# Patient Record
Sex: Female | Born: 1985 | Race: White | Hispanic: No | Marital: Single | Smoking: Never smoker
Health system: Southern US, Community
[De-identification: ages and names within clinical notes are randomized; demographics above are authoritative.]

## PROBLEM LIST (undated history)

## (undated) DIAGNOSIS — G40409 Other generalized epilepsy and epileptic syndromes, not intractable, without status epilepticus: Secondary | ICD-10-CM

## (undated) DIAGNOSIS — G40209 Localization-related (focal) (partial) symptomatic epilepsy and epileptic syndromes with complex partial seizures, not intractable, without status epilepticus: Secondary | ICD-10-CM

## (undated) HISTORY — DX: Localization-related (focal) (partial) symptomatic epilepsy and epileptic syndromes with complex partial seizures, not intractable, without status epilepticus: G40.209

## (undated) HISTORY — PX: OTHER SURGICAL HISTORY: SHX169

## (undated) HISTORY — DX: Other generalized epilepsy and epileptic syndromes, not intractable, without status epilepticus: G40.409

---

## 2008-12-05 ENCOUNTER — Emergency Department (HOSPITAL_COMMUNITY): Admission: EM | Admit: 2008-12-05 | Discharge: 2008-12-05 | Payer: Self-pay | Admitting: Emergency Medicine

## 2009-07-25 ENCOUNTER — Emergency Department (HOSPITAL_COMMUNITY): Admission: EM | Admit: 2009-07-25 | Discharge: 2009-07-25 | Payer: Self-pay | Admitting: Family Medicine

## 2010-04-24 IMAGING — CR DG FOREARM 2V*L*
2 series · 2 of 2 positions shown · non-contrast
Comparison: None

CLINICAL DATA: History given of injury and pain.  History of soft
tissue swelling.

LEFT FOREARM - 2 VIEW

[view not recorded (1 of 2)]
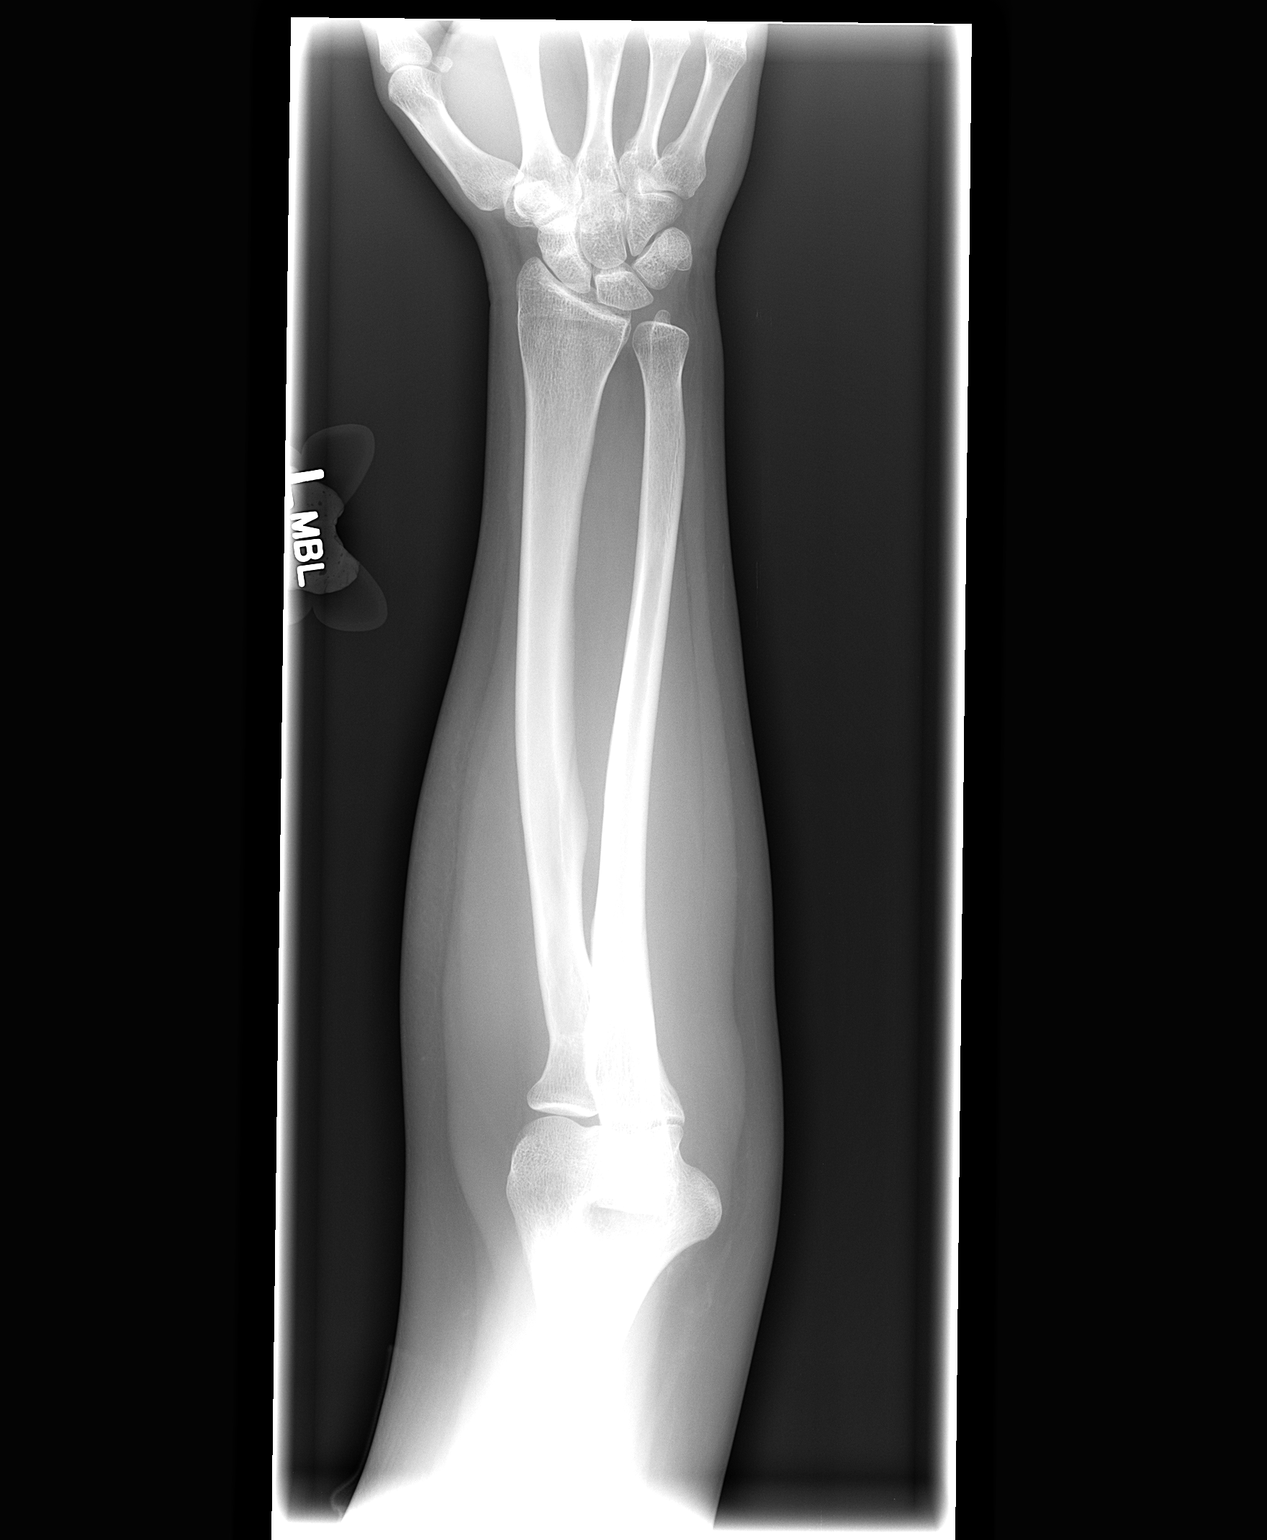

[view not recorded (2 of 2)]
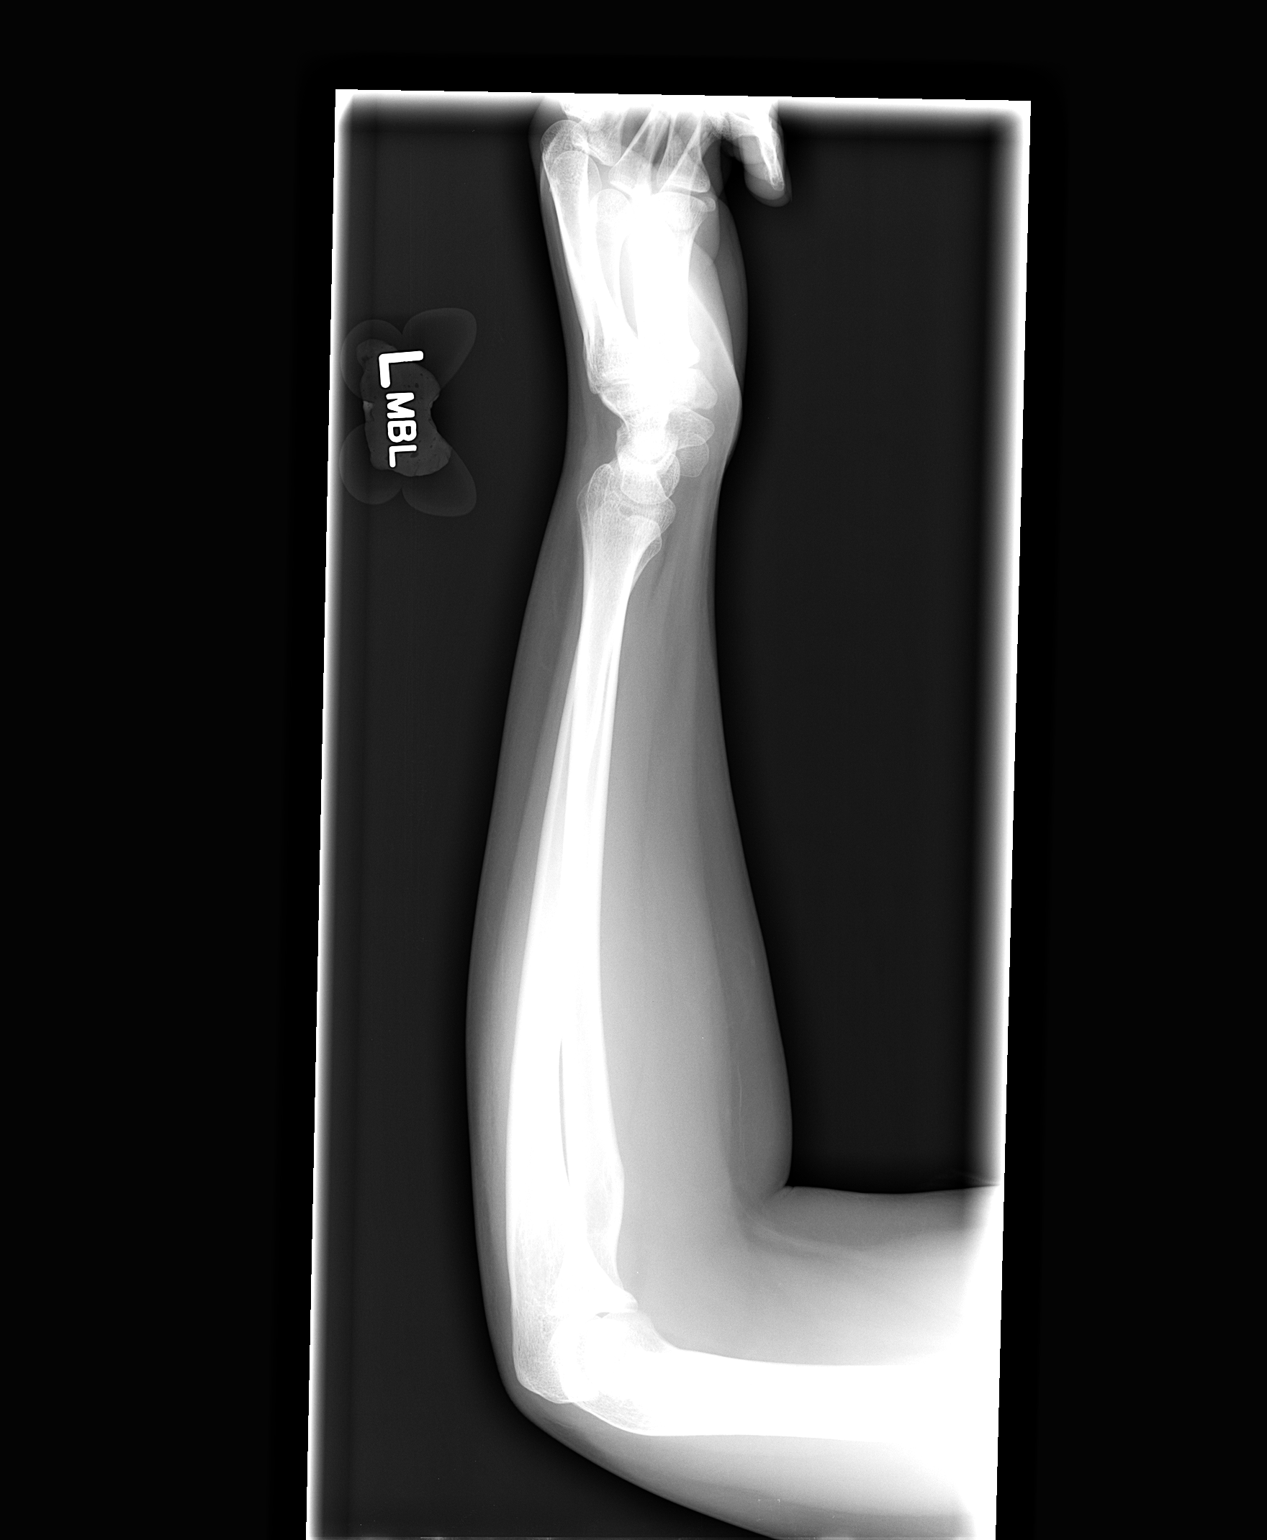

[2 of 2 positions shown; findings below may reference images not displayed]

FINDINGS: Alignment is normal.  Joint spaces are preserved.  No
fracture or dislocation is evident.  No soft tissue lesions are
seen.
IMPRESSION: No fracture is evident.

## 2012-07-04 ENCOUNTER — Telehealth: Payer: Self-pay | Admitting: *Deleted

## 2012-07-04 NOTE — Telephone Encounter (Signed)
patient requesting lamotrigne -100mg  refill, need medicine by thurday, and can't go without it.

## 2012-07-05 MED ORDER — LAMOTRIGINE 100 MG PO TABS: 100.0000 mg | ORAL_TABLET | Freq: Two times a day (BID) | ORAL | Status: DC

## 2012-07-05 NOTE — Telephone Encounter (Signed)
I was out of the office yesterday.  [facility] Patient.  [facility]

## 2012-07-10 ENCOUNTER — Encounter: Payer: Self-pay | Admitting: Neurology

## 2012-07-10 ENCOUNTER — Ambulatory Visit (INDEPENDENT_AMBULATORY_CARE_PROVIDER_SITE_OTHER): Payer: PRIVATE HEALTH INSURANCE | Admitting: Neurology

## 2012-07-10 VITALS — BP 110/69 | HR 85 | Ht 64.0 in | Wt 192.0 lb

## 2012-07-10 DIAGNOSIS — G40409 Other generalized epilepsy and epileptic syndromes, not intractable, without status epilepticus: Secondary | ICD-10-CM

## 2012-07-10 DIAGNOSIS — R569 Unspecified convulsions: Secondary | ICD-10-CM

## 2012-07-10 DIAGNOSIS — G40209 Localization-related (focal) (partial) symptomatic epilepsy and epileptic syndromes with complex partial seizures, not intractable, without status epilepticus: Secondary | ICD-10-CM

## 2012-07-10 NOTE — Progress Notes (Signed)
HPI:  27 year old right-handed white single female, previous Dr. Imagene Gurney patient, follow up for seizure.  She was the product of a  [redacted] week gestation and  presented as breech presentation. She had hyperbilirubinemia at birth and states that she was on a  respirator and had chest tubes for pneumothoraces. Her development is unknown but she began having seizures at 54 months of age with fever.  At that time EEG was normal and she was treated with phenobarbital for 16 months. At age 21-1/1 years while  off of phenobarbital she had her first nonfebrile seizure. She was started on phenobarbital at age 106 months which was discontinued after one year. At age 44 years she had adversive seizures characterized with  her head turning to the right, her eyes elevating and right arm movement. She was confused and aphasic after the seizure. At age 89 an EEG showed 2-3Hz  spike and wave activity more on the left than the right. MRI study of the brain in 1994 was normal. She was started on Tegretol.  At age 89 an EEG was abnormal showing right occipital paroxysmal polymorphic spike and slow wave discharges. In 1995 she had a seizure with fever and another in November 1995. Repeat EEG in February 1997 at age 73 was abnormal showing diffuse polyspike and wave activity. Tegretol was discontinued in 1999. An EEG July 2002 was normal.  From ages 80 to 89 she was on no medications and had no seizures. At age 25 she had a seizure and  was placed on Depakote. She never had seizures on Depakote. At her request she discontinued depakote at age 46 to change to lamotrigine. She has not had any seizures on lamotrigine.   She denies macropsia, micropsia, dj vu, strange odors or tastes. At times she had a premonition that something was going to happen before her seizure. She denies any side effects such as ataxia or double vision.   She is currently working as an Programmer, multimedia, Clinical research associate for [employer], driving, living alone.  She is taking  Lamotrigen 100mg  bid, no side effect.  Last seizure was when at age 3 years, no prodrome. Last lamotrigine level was 2.  Review of Systems  Out of a complete 14 system review, the patient complains of only the following symptoms, and all other reviewed systems are negative.   Constitutional:   N/A Cardiovascular:  N/A Ear/Nose/Throat:  N/A Skin: N/A Eyes: N/A Respiratory: N/A Gastroitestinal: N/A    Hematology/Lymphatic:  N/A Endocrine:  N/A Musculoskeletal:N/A Allergy/Immunology: N/A Neurological: N/A Psychiatric:    N/A  PHYSICAL EXAMINATOINS:  Generalized: In no acute distress  Neck: Supple, no carotid bruits   Cardiac: Regular rate rhythm  Pulmonary: Clear to auscultation bilaterally  Musculoskeletal: No deformity  Neurological examination  Mentation: Alert oriented to time, place, history taking, and causual conversation  Cranial nerve II-XII: Pupils were equal round reactive to light extraocular movements were full, visual field were full on confrontational test. facial sensation and strength were normal. hearing was intact to finger rubbing bilaterally. Uvula tongue midline.  head turning and shoulder shrug and were normal and symmetric.Tongue protrusion into cheek strength was normal.  Motor: normal tone, bulk and strength.  Sensory: Intact to fine touch, pinprick, preserved vibratory sensation, and proprioception at toes.  Coordination: Normal finger to nose, heel-to-shin bilaterally there was no truncal ataxia  Gait: Rising up from seated position without assistance, normal stance, without trunk ataxia, moderate stride, good arm swing, smooth turning, able to perform tiptoe, and  heel walking without difficulty.   Romberg signs: Negative  Deep tendon reflexes: Brachioradialis 2/2, biceps 2/2, triceps 2/2, patellar 2/2, Achilles 2/2, plantar responses were flexor bilaterally.  Assessement  And Plan:  27 yo White Female with long standing history of  generalized epilepsy.  1. Will continue current lamotrigine. 2. She will contact our office when she is ready to taper off AEDs. 3. It was discussed by Dr. Sandria Manly in the past for her coming off medication and not driving a car for 6 months. 4. RTC with Eber Jones in one year

## 2012-11-24 ENCOUNTER — Other Ambulatory Visit: Payer: Self-pay | Admitting: Neurology

## 2013-03-05 ENCOUNTER — Telehealth: Payer: Self-pay | Admitting: Neurology

## 2013-03-05 MED ORDER — LAMOTRIGINE 100 MG PO TABS: 100.0000 mg | ORAL_TABLET | Freq: Two times a day (BID) | ORAL | Status: DC

## 2013-03-05 NOTE — Telephone Encounter (Signed)
Rx has been sent to [facility] 

## 2013-06-27 ENCOUNTER — Other Ambulatory Visit: Payer: Self-pay | Admitting: Neurology

## 2013-07-02 ENCOUNTER — Ambulatory Visit (INDEPENDENT_AMBULATORY_CARE_PROVIDER_SITE_OTHER): Payer: PRIVATE HEALTH INSURANCE | Admitting: Neurology

## 2013-07-02 ENCOUNTER — Encounter (INDEPENDENT_AMBULATORY_CARE_PROVIDER_SITE_OTHER): Payer: Self-pay

## 2013-07-02 ENCOUNTER — Encounter: Payer: Self-pay | Admitting: Neurology

## 2013-07-02 VITALS — Ht 64.0 in | Wt 198.0 lb

## 2013-07-02 DIAGNOSIS — G40409 Other generalized epilepsy and epileptic syndromes, not intractable, without status epilepticus: Secondary | ICD-10-CM

## 2013-07-02 DIAGNOSIS — R569 Unspecified convulsions: Secondary | ICD-10-CM

## 2013-07-02 MED ORDER — LAMOTRIGINE 100 MG PO TABS: 100.0000 mg | ORAL_TABLET | Freq: Two times a day (BID) | ORAL | Status: AC

## 2013-07-02 NOTE — Progress Notes (Signed)
HPI:  28 year old right-handed white single female, previous Dr. Imagene GurneyLove's patient, follow up for seizure.  She was the product of a  [redacted] week gestation and  presented as breech presentation. She had hyperbilirubinemia at birth and states that she was on a  respirator and had chest tubes for pneumothoraces. Her development is unknown but she began having seizures at 6111 months of age with fever.  At that time EEG was normal and she was treated with phenobarbital for 16 months.   At age 89-1/2 years while off of phenobarbital she had her first nonfebrile seizure. She was started on phenobarbital at age [age] months which was discontinued after one year.   At age 23 years she had seizures againe characterized with  her head turning to the right, her eyes elevating and right arm movement. She was confused and aphasic after the seizure. At age 76 an EEG showed 2-3Hz  spike and wave activity more on the left than the right. MRI study of the brain in 1994 was normal.  She was started on Tegretol.  At age 89 an EEG was abnormal showing right occipital paroxysmal polymorphic spike and slow wave discharges. In 1995 she had a seizure with fever and another in November 1995. Repeat EEG in February 1997 at age [age] was abnormal showing diffuse polyspike and wave activity. Tegretol was discontinued in 1999. An EEG July 2002 was normal.  From ages [age] to 1915 she was on no medications and had no seizures. At age [age] she had a seizure and  was placed on Depakote. She never had seizures on Depakote. At her request she discontinued depakote at age [age] to change to lamotrigine. She has not had any seizures on lamotrigine.   She denies macropsia, micropsia, dj vu, strange odors or tastes. At times she had a premonition that something was going to happen before her seizure. She denies any side effects such as ataxia or double vision.   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  She is  taking Lamotrigen 100mg  bid, no side effect.  Last seizure was when at age [age] years, no prodrome. Last lamotrigine level was 2.  She works as an Programmer, multimediaeditor. [place], she is doing very well, no recurrent seizure,    Review of Systems  Out of a complete 14 system review, the patient complains of only the following symptoms, and all other reviewed systems are negative.    PHYSICAL EXAMINATOINS:  Generalized: In no acute distress  Neck: Supple, no carotid bruits   Cardiac: Regular rate rhythm  Pulmonary: Clear to auscultation bilaterally  Musculoskeletal: No deformity  Neurological examination  Mentation: Alert oriented to time, place, history taking, and causual conversation  Cranial nerve II-XII: Pupils were equal round reactive to light extraocular movements were full, visual field were full on confrontational test. facial sensation and strength were normal. hearing was intact to finger rubbing bilaterally. Uvula tongue midline.  head turning and shoulder shrug and were normal and symmetric.Tongue protrusion into cheek strength was normal.  Motor: normal tone, bulk and strength.  Sensory: Intact to fine touch, pinprick, preserved vibratory sensation, and proprioception at toes.  Coordination: Normal finger to nose, heel-to-shin bilaterally there was no truncal ataxia  Gait: Rising up from seated position without assistance, normal stance, without trunk ataxia, moderate stride, good arm swing, smooth turning, able to perform tiptoe, and heel walking without difficulty.   Romberg signs:  Negative  Deep tendon reflexes: Brachioradialis 2/2, biceps 2/2, triceps 2/2, patellar 2/2, Achilles 2/2, plantar responses were flexor bilaterally.  Assessement  And Plan:  28 yo White Female with long standing history of generalized epilepsy.  Will continue current lamotrigine, generic 100 mg twice a day, prescription of one year was given to her. She is moving to  [place] in April 2015

## 2013-07-03 DIAGNOSIS — Z0289 Encounter for other administrative examinations: Secondary | ICD-10-CM
